# Patient Record
Sex: Female | Born: 1980 | Race: White | Hispanic: No | Smoking: Never smoker
Health system: Southern US, Community
[De-identification: ages and names within clinical notes are randomized; demographics above are authoritative.]

## PROBLEM LIST (undated history)

## (undated) DIAGNOSIS — K219 Gastro-esophageal reflux disease without esophagitis: Secondary | ICD-10-CM

## (undated) DIAGNOSIS — R197 Diarrhea, unspecified: Secondary | ICD-10-CM

## (undated) DIAGNOSIS — R110 Nausea: Secondary | ICD-10-CM

## (undated) HISTORY — DX: Gastro-esophageal reflux disease without esophagitis: K21.9

## (undated) HISTORY — DX: Diarrhea, unspecified: R19.7

## (undated) HISTORY — DX: Nausea: R11.0

## (undated) HISTORY — PX: TUBAL LIGATION: SHX77

---

## 2014-08-29 ENCOUNTER — Other Ambulatory Visit (HOSPITAL_COMMUNITY): Payer: Self-pay | Admitting: Specialist

## 2014-08-29 DIAGNOSIS — Z87798 Personal history of other (corrected) congenital malformations: Secondary | ICD-10-CM

## 2014-09-12 ENCOUNTER — Ambulatory Visit (HOSPITAL_COMMUNITY): Payer: Self-pay

## 2014-09-14 ENCOUNTER — Ambulatory Visit (HOSPITAL_COMMUNITY)
Admission: RE | Admit: 2014-09-14 | Discharge: 2014-09-14 | Disposition: A | Discharge status: Home / Self Care | Payer: PRIVATE HEALTH INSURANCE | Source: Ambulatory Visit | Attending: Specialist | Admitting: Specialist

## 2014-09-14 ENCOUNTER — Encounter (HOSPITAL_COMMUNITY): Payer: Self-pay

## 2014-09-14 ENCOUNTER — Other Ambulatory Visit (HOSPITAL_COMMUNITY): Payer: Self-pay | Admitting: Specialist

## 2014-09-14 DIAGNOSIS — Z8679 Personal history of other diseases of the circulatory system: Secondary | ICD-10-CM | POA: Diagnosis present

## 2014-09-14 DIAGNOSIS — Z3A20 20 weeks gestation of pregnancy: Secondary | ICD-10-CM | POA: Insufficient documentation

## 2014-09-14 DIAGNOSIS — Q249 Congenital malformation of heart, unspecified: Principal | ICD-10-CM

## 2014-09-14 DIAGNOSIS — Z87798 Personal history of other (corrected) congenital malformations: Secondary | ICD-10-CM

## 2014-09-14 DIAGNOSIS — O9989 Other specified diseases and conditions complicating pregnancy, childbirth and the puerperium: Secondary | ICD-10-CM | POA: Insufficient documentation

## 2014-09-14 DIAGNOSIS — Z3A21 21 weeks gestation of pregnancy: Secondary | ICD-10-CM | POA: Diagnosis not present

## 2014-09-14 DIAGNOSIS — O99412 Diseases of the circulatory system complicating pregnancy, second trimester: Secondary | ICD-10-CM | POA: Insufficient documentation

## 2014-09-14 NOTE — Consult Note (Signed)
Maternal Fetal Medicine Consultation  Requesting Provider(s): Jeannett SeniorAlice Newell, MD  Reason for consultation: History of VSD  HPI: Nicole Floyd is a 34 yo G1P0 currently at 21w 5d who was seen for consultation due to a personal history of VSD.  The patient did not require surgical repair and the VSD closed spontaneously.  The patient underwent a cardiology evaluation in August 2015 due to complaints of palpitations.  The patient reports that the echocardiogram was within normal limits and no further evaluation or treatment was recommended by cardiology.  The patient reports that her mother was diagnosed with peripartum cardiomyopathy - which has since improved over time.  Her prenatal course has otherwise been uncomplicated.  She is without complaints today.  OB History: OB History    Gravida Para Term Preterm AB TAB SAB Ectopic Multiple Living   1 0 0 0 0 0 0 0 0 0       PMH: as per HPI  PSH: Fracture of the right forearm  Meds: Prenatal vitamins  Allergies: No Known Allergies   FH: denies family history of birth defects or hereditary disorders  Soc:  History   Social History  . Marital Status: Unknown    Spouse Name: N/A    Number of Children: N/A  . Years of Education: N/A   Occupational History  . Not on file.   Social History Main Topics  . Smoking status: Not on file  . Smokeless tobacco: Not on file  . Alcohol Use: Not on file  . Drug Use: Not on file  . Sexual Activity: Not on file   Other Topics Concern  . Not on file   Social History Narrative    Review of Systems: no vaginal bleeding or cramping/contractions, no LOF, no nausea/vomiting. All other systems reviewed and are negative   PE:  239.5 #, 133/50, 91  GEN: well-appearing female ABD: gravid, NT  Ultrasound: Single IUP at 21w 5d Personal history of VSD - did not require surgical repair Mild, bilateral urinary tract dilation without calyceal dilation is noted (UTD A1-2) The remainder of the  fetal anatomy appears normal The femur length measures at the 3rd %tile. All other long bones are appropriate. (likely of no clinical significance) Posterior placenta without previa Normal amniotic fluid volume  A/P: 1) Single IUP at 21w 5d         2) Personal history of VSD - resolved spontaneously.  Do not anticipate any issues with the pregnancy.  Maternal congenital heart defects are associated with a higher risk of CHD in offspring - approx 2% but risk varies based on the specific lesion. See separate note from [employer] [facility] Counselor. The screening heart exam today appears normal, but a small VSD can be easily missed.  Will schedule a formal fetal Echo with Peds cardiology.         3) Mild urinary tract dilation (pyelectasis) noted on exam today - will reevaluate in the 3rd trimester.     Thank you for the opportunity to be a part of the care of Nicole Floyd. Please contact our office if we can be of further assistance.   I spent approximately 30 minutes with this patient with over 50% of time spent in face-to-face counseling.  Alpha GulaPaul Kenzleigh Sedam, MD Maternal Fetal Medicine

## 2014-09-14 NOTE — Progress Notes (Signed)
Genetic Counseling  High-Risk Gestation Note  Appointment Date:  09/14/2014 Referred By: Jamey Reas, MD Date of Birth:  1981/07/29   Pregnancy History: G1P0000 Estimated Date of Delivery: 01/26/15 Estimated Gestational Age: 68w6dAttending: PBenjaman Lobe MD   I met with Mrs. Nicole Vassellfor genetic counseling because of a personal history of congenital heart disease.  We began by reviewing the family history in detail. Nicole Floyd reported that she was born with a heart murmur which closed on its own. She did not require surgical or medical treatment. The patient's records indicate that this was a ventricular septal defect. Nicole Floyd stated that she did not recall the specific type of congenital heart disease. She is otherwise reportedly healthy. She reported no known exposures or complications during her mother's pregnancy with her. No additional relatives were reported with congenital heart disease. Congenital heart defects occur in approximately 1% of pregnancies.  Congenital heart defects may occur due to multifactorial influences, chromosomal abnormalities, genetic syndromes or environmental exposures.  Isolated heart defects are generally multifactorial.  Given the reported family history and assuming multifactorial inheritance, we discussed that recurrence risk for an isolated congenital heart defect for offspring of a mother with congenital heart disease is approximately 6%. We discussed the screening options of targeted ultrasound, which was performed today, and fetal echocardiogram. The patient elected to schedule a fetal echocardiogram but stated that would further consider this screening option and is not sure that she is interested in this exam during the pregnancy.   The family histories were otherwise found to be noncontributory for birth defects, mental retardation, and known genetic conditions. Without further information regarding the provided family history, an accurate  genetic risk cannot be calculated. Further genetic counseling is warranted if more information is obtained.  Detailed ultrasound was performed at the time of today's visit. Complete ultrasound results reported separately. Mild, bilateral urinary tract dilation (pyelectasis) was visualized at this time (451mbilaterally). We discussed that fetal urinary tract dilation/pyelectasis is defined as the dilatation of the fetal renal pelvis/pelvises due to excess urine. This finding is estimated to occur in 2-3% of fetuses.  The female to female ratio is 2:1.  Typically, babies with mild pyelectasis are born normal and healthy and we are usually unable to determine why this extra fluid is present.  This urine accumulation may regress, stay the same or continue to accumulate.  The more fluid that accumulates, the more likely this fluid could be the result of a compromise in kidney function, an obstruction, or narrowing of the ureters which transport urine out of the body, thus causing backflow of fluid into the kidneys.  Therefore, it is important to follow pyelectasis to make sure it does not become more concerning.  Also, in some cases postnatal evaluation of baby's kidneys may be warranted.  We discussed that the finding of pyelectasis is associated with an increased risk for fetal aneuploidy.  This risk is highest when other anomalies or fetal differences are visualized.   We briefly discussed available screening and testing options for fetal aneuploidy, specifically Down syndrome. Given the patient's age alone, the pregnancy is not considered to be at increased risk for fetal aneuploidy. Nicole Floyd declined detailed discussion of screening and testing options for Down syndrome. She stated that she previously declined these optional screens and is not interested in screening/testing for Down syndrome in the pregnancy. Follow-up ultrasound was recommended in the third trimester to reassess fetal kidneys and was  scheduled for 11/16/14. The patient  planned to further discuss this option with her OB. She stated that she may not be interested in this follow-up exam in our office given the travel distance for her. She understands that ultrasound cannot rule out all birth defects or genetic syndromes.  Nicole Floyd was provided with written information regarding cystic fibrosis (CF) including the carrier frequency and incidence in the Caucasian and Hispanic populations, the availability of carrier testing and prenatal diagnosis if indicated.  In addition, we discussed that CF is routinely screened for as part of the Protection newborn screening panel.  She declined CF carrier testing today.   Nicole Floyd denied exposure to environmental toxins or chemical agents. She denied the use of alcohol, tobacco or street drugs. She denied significant viral illnesses during the course of her pregnancy. Her medical and surgical histories were contributory for congenital heart disease as previously discussed. Please see separate MFM consult note for additional discussion regarding maternal medical history.    I counseled Nicole Floyd regarding the above risks and available options.  The approximate face-to-face time with the genetic counselor was 30 minutes.  Chipper Oman, MS Certified Genetic Counselor 09/14/2014

## 2014-09-17 ENCOUNTER — Other Ambulatory Visit (HOSPITAL_COMMUNITY): Payer: Self-pay

## 2014-11-16 ENCOUNTER — Ambulatory Visit (HOSPITAL_COMMUNITY): Payer: PRIVATE HEALTH INSURANCE

## 2015-03-16 IMAGING — US US OB DETAIL+14 WK
1 series · 12 of 28 positions shown · non-contrast
Comparison: none

[Series 1: maternal vsd · 0.23mm/px · 12 of 113 slices shown]
[im 5/113]
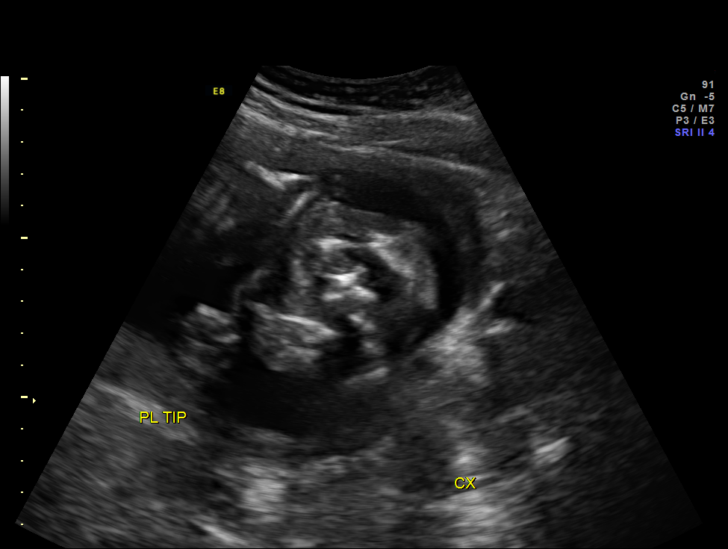
[im 13/113]
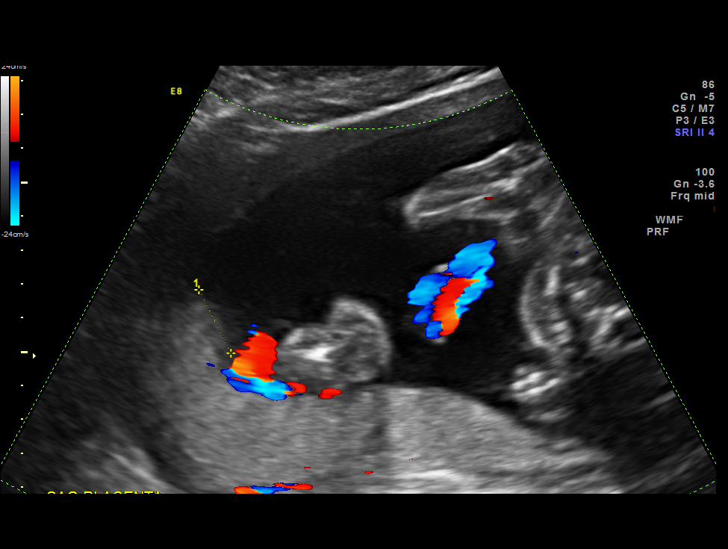
[im 21/113]
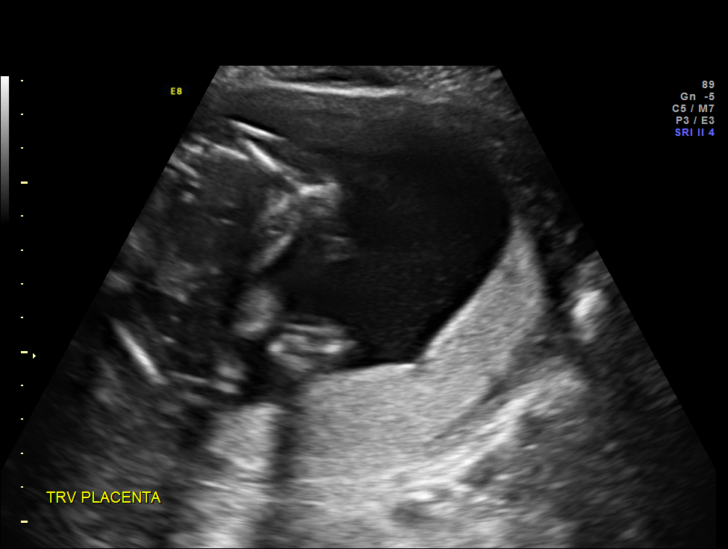
[im 34/113]
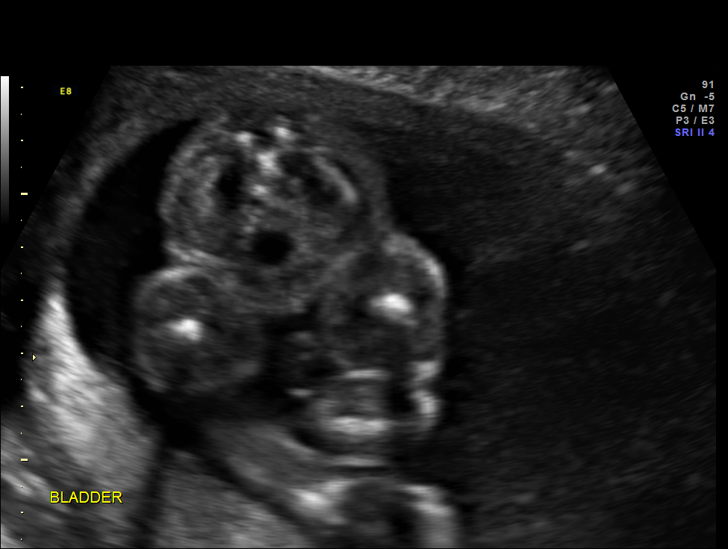
[im 42/113]
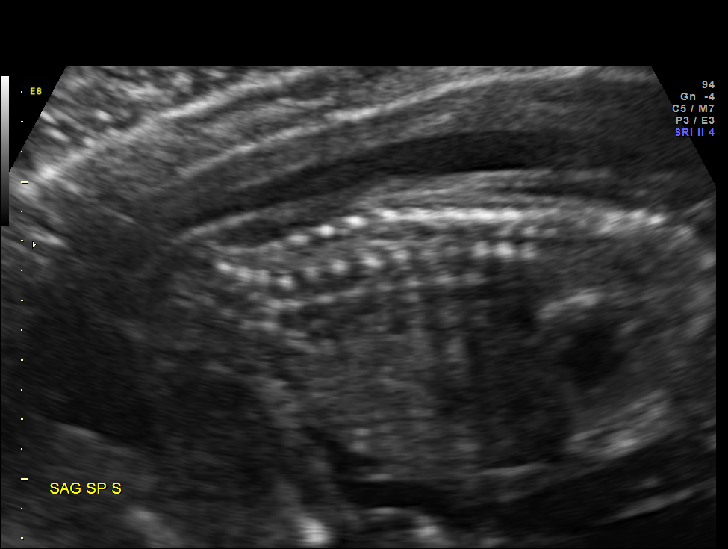
[im 50/113]
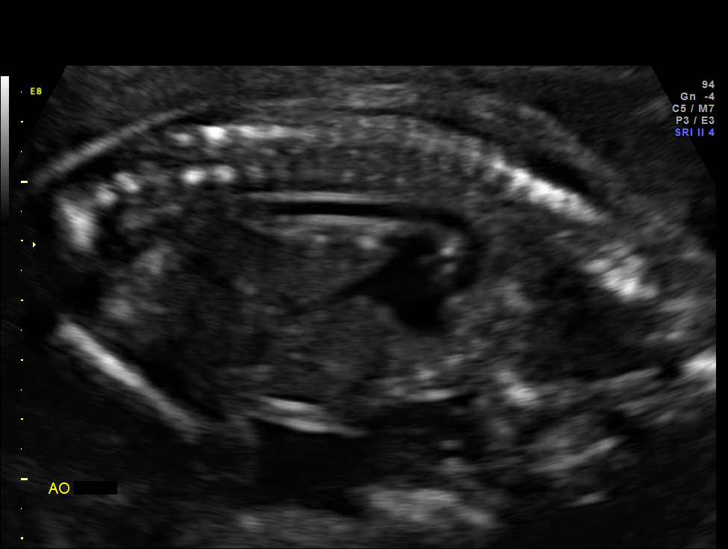
[im 63/113]
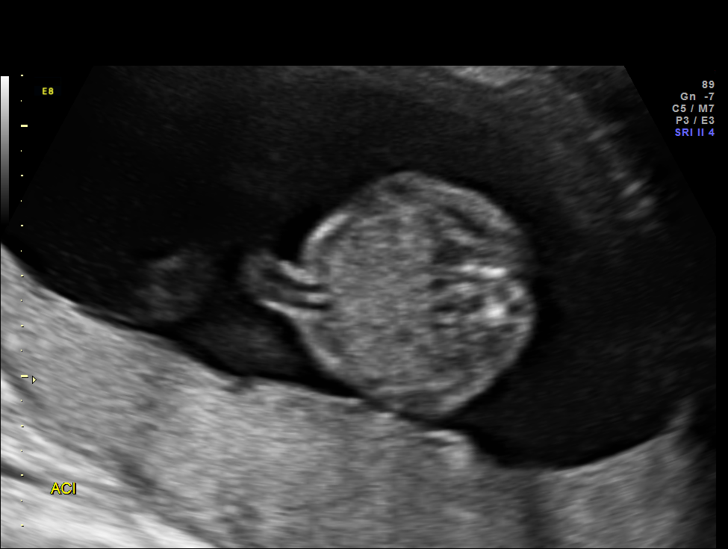
[im 71/113]
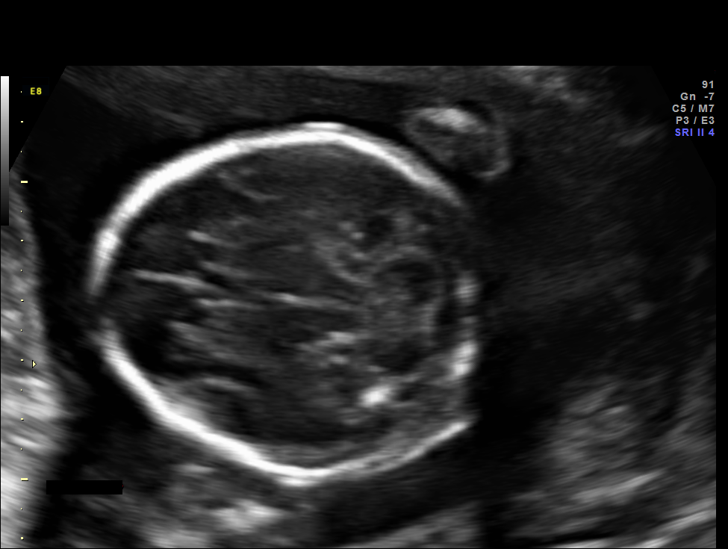
[im 79/113]
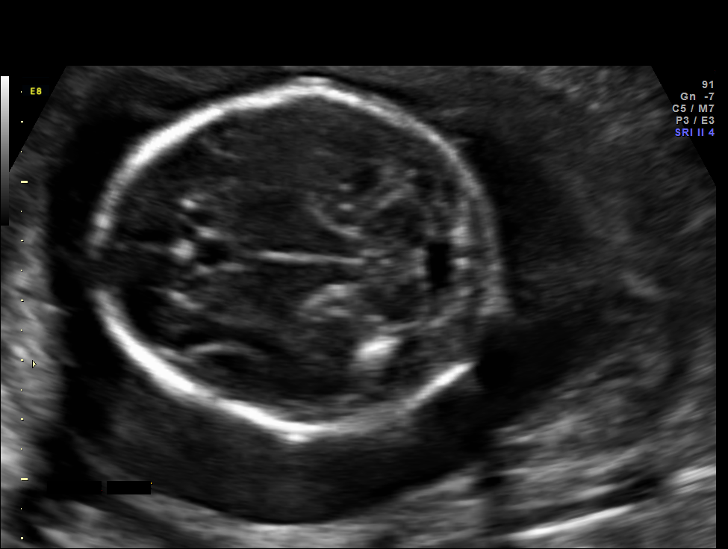
[im 92/113]
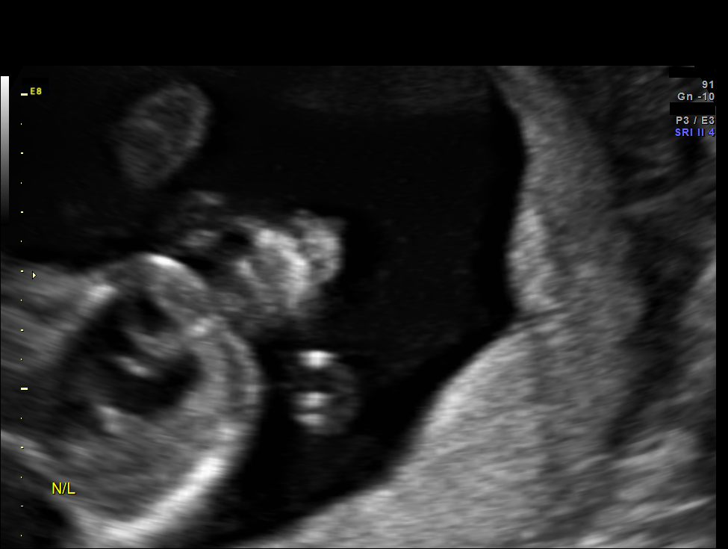
[im 100/113]
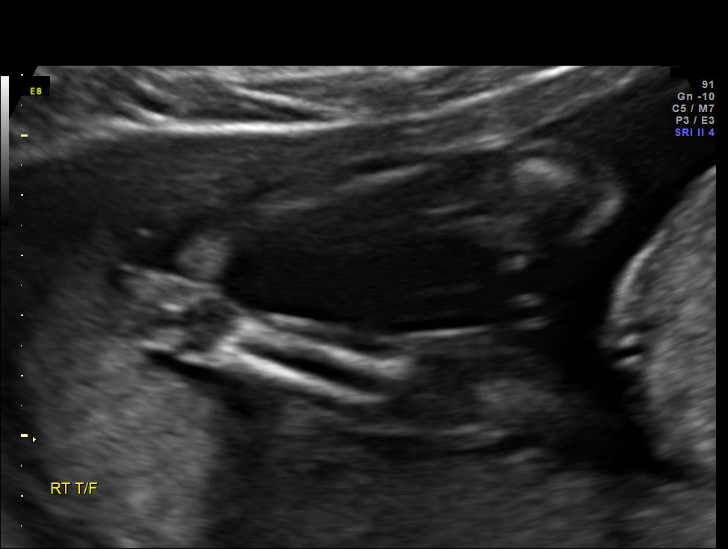
[im 108/113]
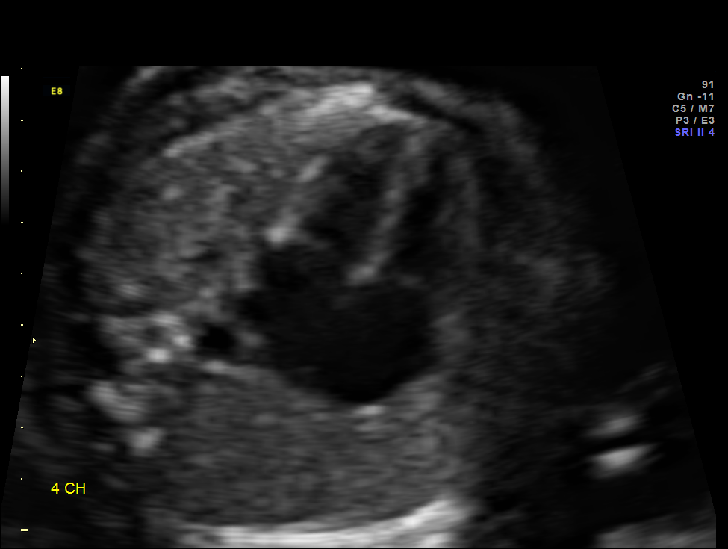

[12 of 28 positions shown; findings below may reference images not displayed]

OBSTETRICS REPORT
                      (Signed Final 09/14/2014 [DATE])

Service(s) Provided

 US OB DETAIL + 14 WK                                  76811.0
Indications

 21 weeks gestation of pregnancy
 History of genetic / anatomic abnormality - patient
 with VSD
 Detailed fetal anatomic survey                        Z36
Fetal Evaluation

 Num Of Fetuses:    1
 Fetal Heart Rate:  142                          bpm
 Cardiac Activity:  Observed
 Presentation:      Cephalic
 Placenta:          Posterior, above cervical
                    os
 P. Cord            Visualized, central
 Insertion:

 Amniotic Fluid
 AFI FV:      Subjectively within normal limits
                                             Larg Pckt:    6.12  cm
Biometry

 BPD:     56.3  mm     G. Age:  23w 1d                CI:         79.4   70 - 86
 OFD:     70.9  mm                                    FL/HC:      15.8   18.4 -

 HC:     202.7  mm     G. Age:  22w 3d       68  %    HC/AC:      1.16   1.06 -

 AC:     174.5  mm     G. Age:  22w 2d       64  %    FL/BPD:     56.8   71 - 87
 FL:        32  mm     G. Age:  20w 0d        3  %    FL/AC:      18.3   20 - 24
 HUM:     32.4  mm     G. Age:  20w 6d       27  %
 HUM:     34.1  mm     G. Age:  21w 4d       46  %
 FL:        32  mm     G. Age:  20w 0d        3  %
 ULN:     30.6  mm     G. Age:  21w 3d       31  %
 TIB:     28.6  mm     G. Age:  20w 3d       18  %
 RAD:     26.7  mm     G. Age:  19w 6d       23  %
 FIB:       28  mm     G. Age:  20w 0d       37  %

 Est. FW:     427  gm    0 lb 15 oz      39  %
Gestational Age
 LMP:           20w 6d        Date:  04/21/14                 EDD:   01/26/15
 U/S Today:     22w 0d                                        EDD:   01/18/15
 Best:          21w 5d     Det. By:  Early Ultrasound         EDD:   01/20/15
                                     (07/10/14)
Anatomy

 Cranium:          Appears normal         Aortic Arch:      Appears normal
 Fetal Cavum:      Appears normal         Ductal Arch:      Appears normal
 Ventricles:       Appears normal         Diaphragm:        Appears normal
 Choroid Plexus:   Appears normal         Stomach:          Appears normal, left
                                                            sided
 Cerebellum:       Appears normal         Abdomen:          Appears normal
 Posterior Fossa:  Appears normal         Abdominal Wall:   Appears nml (cord
                                                            insert, abd wall)
 Nuchal Fold:      Appears normal         Cord Vessels:     Appears normal (3
                                                            vessel cord)
 Face:             Profile appears        Kidneys:          Bilat pyelectasis,
                   normal
                                                            Rt 4mm, Lt 4mm
 Lips:             Appears normal         Bladder:          Appears normal
 Heart:            Appears normal         Spine:            Appears normal
                   (4CH, axis, and
                   situs)
 RVOT:             Appears normal         Lower             Appears normal
                                          Extremities:
 LVOT:             Appears normal         Upper             Appears normal
                                          Extremities:

 Other:  Fetus appears to be a male.
Cervix Uterus Adnexa

 Cervical Length:    4.26     cm

 Cervix:       Normal appearance by transabdominal scan.
 Uterus:       No abnormality visualized.
 Cul De Sac:   No free fluid seen.
 Left Ovary:    Not visualized.
 Right Ovary:   Not visualized.

 Adnexa:     No abnormality visualized.
Impression

 Single IUP at 21w 5d
 Personal history of VSD - did not require surgical repair
 Mild, bilateral urinary tract dilation without calyceal dilation is
 noted (UTD A1-2)
 The remainder of the fetal anatomy appears normal
 The femur length measures at the 3rd %tile. All other long
 bones are appropriate. (likely of no clinical significance)
 Posterior placenta without previa
 Normal amniotic fluid volume
Recommendations

 Fetal echo scheduled
 Recommend follow up in the 3rd trimester to reevaluate the
 fetal kidneys.
 questions or concerns.

## 2015-07-20 ENCOUNTER — Encounter (HOSPITAL_COMMUNITY): Payer: Self-pay | Admitting: *Deleted

## 2016-03-24 ENCOUNTER — Ambulatory Visit: Payer: Self-pay | Admitting: "Endocrinology

## 2016-05-27 ENCOUNTER — Encounter: Payer: Self-pay | Admitting: "Endocrinology

## 2016-06-19 ENCOUNTER — Ambulatory Visit: Payer: Self-pay | Admitting: "Endocrinology

## 2024-05-10 ENCOUNTER — Encounter: Payer: Self-pay | Admitting: Gastroenterology

## 2024-06-29 ENCOUNTER — Encounter: Payer: Self-pay | Admitting: Gastroenterology

## 2024-06-29 ENCOUNTER — Ambulatory Visit: Payer: PRIVATE HEALTH INSURANCE | Admitting: Gastroenterology

## 2024-06-29 ENCOUNTER — Other Ambulatory Visit (INDEPENDENT_AMBULATORY_CARE_PROVIDER_SITE_OTHER): Payer: PRIVATE HEALTH INSURANCE

## 2024-06-29 ENCOUNTER — Other Ambulatory Visit: Payer: Self-pay | Admitting: Gastroenterology

## 2024-06-29 VITALS — BP 106/68 | HR 66 | Ht 68.0 in | Wt 259.0 lb

## 2024-06-29 DIAGNOSIS — R197 Diarrhea, unspecified: Secondary | ICD-10-CM

## 2024-06-29 DIAGNOSIS — K6289 Other specified diseases of anus and rectum: Secondary | ICD-10-CM

## 2024-06-29 DIAGNOSIS — R110 Nausea: Secondary | ICD-10-CM | POA: Diagnosis not present

## 2024-06-29 DIAGNOSIS — R194 Change in bowel habit: Secondary | ICD-10-CM

## 2024-06-29 DIAGNOSIS — K219 Gastro-esophageal reflux disease without esophagitis: Secondary | ICD-10-CM

## 2024-06-29 DIAGNOSIS — R1084 Generalized abdominal pain: Secondary | ICD-10-CM

## 2024-06-29 LAB — CBC WITH DIFFERENTIAL/PLATELET
Basophils Absolute: 0 K/uL (ref 0.0–0.1)
Basophils Relative: 0.8 % (ref 0.0–3.0)
Eosinophils Absolute: 0.1 K/uL (ref 0.0–0.7)
Eosinophils Relative: 1.3 % (ref 0.0–5.0)
HCT: 39.4 % (ref 36.0–46.0)
Hemoglobin: 13.2 g/dL (ref 12.0–15.0)
Lymphocytes Relative: 33 % (ref 12.0–46.0)
Lymphs Abs: 2.1 K/uL (ref 0.7–4.0)
MCHC: 33.5 g/dL (ref 30.0–36.0)
MCV: 92.8 fl (ref 78.0–100.0)
Monocytes Absolute: 0.6 K/uL (ref 0.1–1.0)
Monocytes Relative: 8.8 % (ref 3.0–12.0)
Neutro Abs: 3.7 K/uL (ref 1.4–7.7)
Neutrophils Relative %: 56.1 % (ref 43.0–77.0)
Platelets: 292 K/uL (ref 150.0–400.0)
RBC: 4.24 Mil/uL (ref 3.87–5.11)
RDW: 12.6 % (ref 11.5–15.5)
WBC: 6.5 K/uL (ref 4.0–10.5)

## 2024-06-29 LAB — TSH: TSH: 2.68 u[IU]/mL (ref 0.35–5.50)

## 2024-06-29 LAB — COMPREHENSIVE METABOLIC PANEL WITH GFR
ALT: 13 U/L (ref 0–35)
AST: 16 U/L (ref 0–37)
Albumin: 4.5 g/dL (ref 3.5–5.2)
Alkaline Phosphatase: 75 U/L (ref 39–117)
BUN: 11 mg/dL (ref 6–23)
CO2: 29 meq/L (ref 19–32)
Calcium: 9.3 mg/dL (ref 8.4–10.5)
Chloride: 100 meq/L (ref 96–112)
Creatinine, Ser: 0.87 mg/dL (ref 0.40–1.20)
GFR: 81.34 mL/min (ref >60.00)
Glucose, Bld: 119 mg/dL — ABNORMAL HIGH (ref 70–99)
Potassium: 3.7 meq/L (ref 3.5–5.1)
Sodium: 138 meq/L (ref 135–145)
Total Bilirubin: 0.4 mg/dL (ref 0.2–1.2)
Total Protein: 7.6 g/dL (ref 6.0–8.3)

## 2024-06-29 LAB — LIPASE: Lipase: 48 U/L (ref 11.0–59.0)

## 2024-06-29 MED ORDER — PANTOPRAZOLE SODIUM 40 MG PO TBEC: 40.0000 mg | DELAYED_RELEASE_TABLET | Freq: Every day | ORAL | 3 refills | Status: DC

## 2024-06-29 MED ORDER — NA SULFATE-K SULFATE-MG SULF 17.5-3.13-1.6 GM/177ML PO SOLN: 1.0000 | Freq: Once | ORAL | 0 refills | Status: AC

## 2024-06-29 NOTE — Patient Instructions (Addendum)
 You have been scheduled for an abdominal ultrasound at [facility] on 07/07/24 at 9:30 am. Please arrive 30 minutes prior to your appointment for registration. Make certain not to have anything to eat or drink after midnight prior to your appointment. Should you need to reschedule your appointment, please contact radiology at [phone]. This test typically takes about 30 minutes to perform.  You have been scheduled for an endoscopy and colonoscopy. Please follow the written instructions given to you at your visit today.  If you use inhalers (even only as needed), please bring them with you on the day of your procedure.  DO NOT TAKE 7 DAYS PRIOR TO TEST- Trulicity (dulaglutide) Ozempic, Wegovy (semaglutide) Mounjaro (tirzepatide) Bydureon Bcise (exanatide extended release)  DO NOT TAKE 1 DAY PRIOR TO YOUR TEST Rybelsus (semaglutide) Adlyxin (lixisenatide) Victoza (liraglutide) Byetta (exanatide) ___________________________________________________________________________  Your provider has requested that you go to the basement level for lab work before leaving today. Press B on the elevator. The lab is located at the first door on the left as you exit the elevator.   Stop Omeprazole.  Start fiber supplement, Benefiber 1 tbsp daily.  We have sent the following medications to your pharmacy for you to pick up at your convenience: Pantoprazole 40 mg daily

## 2024-06-29 NOTE — Progress Notes (Signed)
 "  Nicole Floyd [id number] [birth year]   Chief Complaint: GERD, diarrhea  Referring Provider: Starla Stapler, FNP Primary GI MD: Sampson  HPI: Nicole Floyd is a 43 y.o. female with past medical history of hypothyroidism, vitamin D deficiency, obesity, GERD who presents today for a complaint of GERD.    Referred by PCP for intermittent diarrhea.  Per referral note, having heartburn issues and frequent diarrhea.  Omeprazole helping some with reflux but bowel habit changes not improved.  Requested GI referral.  Stool studies 07/2023: Negative C. difficile and stool culture  Labs 10/14/2023: Normal lipase, CMP, CBC without anemia, negative alpha gal  Labs 03/09/2024: Normal TSH, hemoglobin A1c 5.4  No apparent testing for H. Pylori.   Discussed the use of AI scribe software for clinical note transcription with the patient, who gave verbal consent to proceed.  History of Present Illness Nicole Floyd is a 42 year old female who presents with gastrointestinal symptoms including diarrhea and reflux.  She has been experiencing gastrointestinal symptoms for the past year, including diarrhea and reflux. Omeprazole alleviates the reflux but causes constipation. The reflux presents as a burning sensation in the abdomen and is sometimes associated with palpitations. She has a history of thyroid issues managed with medication adjustments, which she believes may have contributed to previous palpitations.  Reports having a normal workup with cardiology.  She experiences frequent bowel movements, up to five times a day, with more frequency at home. Bowel movements often start with a normal stool followed by several trips to the bathroom with loose or diarrhea-type stools. Abdominal discomfort occurs after eating, described as 'acid all throughout' her abdomen, and she sometimes experiences nausea without vomiting. Occasional black stools have been noted, but no regular blood in the stool. Anal  irritation and itching occur due to frequent bowel movements, managed with Vaseline lotion. No blood on toilet paper or lumps suggesting hemorrhoids.  Has not had recent imaging studies like an ultrasound or CT scan. She has not undergone an upper endoscopy or colonoscopy before.  There is a family history of celiac disease in a cousin, but no immediate family history of esophageal, stomach, or colon cancer, nor inflammatory bowel disease.  She has had recent episodes of lower abdominal cramping, which led her to visit urgent care, suspecting a UTI, but tests were negative. She also experienced a false positive strep test. She has had a tubal ligation but no other abdominal surgeries. No unintentional weight loss or significant changes in appetite.  Previous GI Procedures/Imaging      Past Medical History:  Diagnosis Date   Diarrhea    GERD (gastroesophageal reflux disease)    Nausea     Past Surgical History:  Procedure Laterality Date   TUBAL LIGATION      Current Outpatient Medications  Medication Sig Dispense Refill   Cholecalciferol (VITAMIN D-3) 25 MCG (1000 UT) CAPS Take by mouth.     ipratropium (ATROVENT) 0.06 % nasal spray Place 2 sprays into both nostrils 4 (four) times daily.     levocetirizine (XYZAL) 2.5 MG/5ML solution Take 2.5 mg by mouth every evening.     levothyroxine (SYNTHROID) 125 MCG tablet Take 125 mcg by mouth every morning.     Oxymetazoline HCl (NASAL SPRAY NA) Place into the nose. (Patient not taking: Reported on 06/29/2024)     Prenatal Vit-Fe Fumarate-FA (PRENATAL MULTIVITAMIN) TABS tablet Take 1 tablet by mouth daily at 12 noon. (Patient not taking: Reported on 06/29/2024)     No current  facility-administered medications for this visit.    Allergies as of 06/29/2024   (No Known Allergies)    Family History  Problem Relation Age of Onset   Diabetes Father    Hypertension Father     Social History   Tobacco Use   Smoking status: Never    Smokeless tobacco: Never  Vaping Use   Vaping status: Never Used  Substance Use Topics   Alcohol use: Never     Review of Systems:    Constitutional: No weight loss, fever, chills Cardiovascular: No chest pain Respiratory: No SOB  Gastrointestinal: See HPI and otherwise negative   Physical Exam:  Vital signs: BP 106/68   Pulse 66   Ht 5' 8 (1.727 m)   Wt 259 lb (117.5 kg)   BMI 39.38 kg/m   Constitutional: Pleasant, obese female in NAD, alert and cooperative Head:  Normocephalic and atraumatic.  Eyes: No scleral icterus.  Respiratory: Respirations even and unlabored. Lungs clear to auscultation bilaterally.  No wheezes, crackles, or rhonchi.  Cardiovascular:  Regular rate and rhythm. No murmurs. No peripheral edema. Gastrointestinal:  Soft, nondistended, generalized discomfort/fullness to palpation but no pain. No rebound or guarding. Normal bowel sounds. No appreciable masses or hepatomegaly. Rectal:  Not performed.  Neurologic:  Alert and oriented x4;  grossly normal neurologically.  Skin:   Dry and intact without significant lesions or rashes. Psychiatric: Oriented to person, place and time. Demonstrates good judgement and reason without abnormal affect or behaviors.   Assessment/Plan:   GERD Nausea Diarrhea Generalized abdominal pain Change in bowel habits Anal irritation Patient seen today for multiple GI complaints, with symptoms ongoing for about a year now.  Has frequent acid reflux and abdominal burning sensation which can be diffuse.  States that omeprazole did help with the reflux but she felt that it caused constipation. States that she often has frequent bowel movements, up to 5 times a day, which is worse when she is at home.  Has fewer bowel movements when she is out and busy.  Often can have a normal stool, followed by several loose stools.  Reports some irritation after bowel movements in her perianal area which is relieved with Vaseline lotion.  Denies  any rectal bleeding, but has seen black stools occasionally.  No prior EGD or colonoscopy.  Denies any recent abdominal imaging studies. Tends to have discomfort after eating and sometimes has nausea and vomiting. History of thyroid dysfunction.  - Schedule EGD/colonoscopy. I thoroughly discussed the procedure with the patient to include nature of the procedure, alternatives, benefits, and risks (including but not limited to bleeding, infection, perforation, anesthesia/cardiac/pulmonary complications). Patient verbalized understanding and gave verbal consent to proceed with procedure.  - Order RUQ US  - Labs today: CBC, CMP, TSH, TTG, IgA, lipase - Stop omeprazole - Start Protonix  40 mg daily - Start Benefiber 1 tablespoon daily   Camie Furbish, PA-C [facility] 06/29/2024, 2:38 PM  Patient Care Team: Starla Stapler, FNP as PCP - General (Family Medicine)   "

## 2024-06-30 ENCOUNTER — Encounter: Payer: Self-pay | Admitting: Gastroenterology

## 2024-06-30 LAB — TISSUE TRANSGLUTAMINASE, IGA: (tTG) Ab, IgA: <1 U/mL

## 2024-06-30 LAB — IGA: Immunoglobulin A: 295 mg/dL (ref 47–310)

## 2024-07-03 ENCOUNTER — Ambulatory Visit: Payer: Self-pay | Admitting: Gastroenterology

## 2024-07-07 ENCOUNTER — Ambulatory Visit (HOSPITAL_COMMUNITY)
Admission: RE | Admit: 2024-07-07 | Discharge: 2024-07-07 | Disposition: A | Discharge status: Home / Self Care | Payer: PRIVATE HEALTH INSURANCE | Source: Ambulatory Visit | Attending: Gastroenterology | Admitting: Gastroenterology

## 2024-07-07 DIAGNOSIS — R1084 Generalized abdominal pain: Secondary | ICD-10-CM | POA: Diagnosis present

## 2024-07-07 DIAGNOSIS — R110 Nausea: Secondary | ICD-10-CM | POA: Insufficient documentation

## 2024-07-07 NOTE — Progress Notes (Signed)
 ____________________________________________________________  Attending physician addendum:  Thank you for sending this case to me. I have reviewed the entire note and agree with the plan.   Victory Brand, MD  ____________________________________________________________

## 2024-07-28 ENCOUNTER — Encounter: Payer: Self-pay | Admitting: Gastroenterology

## 2024-08-02 ENCOUNTER — Ambulatory Visit: Payer: PRIVATE HEALTH INSURANCE | Admitting: Gastroenterology

## 2024-08-02 ENCOUNTER — Encounter: Payer: Self-pay | Admitting: Gastroenterology

## 2024-08-02 VITALS — BP 117/76 | HR 70 | Temp 98.7°F | Resp 17 | Ht 68.0 in | Wt 259.0 lb

## 2024-08-02 DIAGNOSIS — K3189 Other diseases of stomach and duodenum: Secondary | ICD-10-CM

## 2024-08-02 DIAGNOSIS — R110 Nausea: Secondary | ICD-10-CM

## 2024-08-02 DIAGNOSIS — K2950 Unspecified chronic gastritis without bleeding: Secondary | ICD-10-CM

## 2024-08-02 DIAGNOSIS — K529 Noninfective gastroenteritis and colitis, unspecified: Secondary | ICD-10-CM

## 2024-08-02 DIAGNOSIS — R12 Heartburn: Secondary | ICD-10-CM

## 2024-08-02 DIAGNOSIS — R1030 Lower abdominal pain, unspecified: Secondary | ICD-10-CM

## 2024-08-02 MED ORDER — SODIUM CHLORIDE 0.9 % IV SOLN: 500.0000 mL | Freq: Once | INTRAVENOUS | Status: DC

## 2024-08-02 NOTE — Patient Instructions (Signed)
 YOU HAD AN ENDOSCOPIC PROCEDURE TODAY AT THE [facility]:   Refer to the procedure report that was given to you for any specific questions about what was found during the examination.  If the procedure report does not answer your questions, please call your gastroenterologist to clarify.  If you requested that your care partner not be given the details of your procedure findings, then the procedure report has been included in a sealed envelope for you to review at your convenience later.  YOU SHOULD EXPECT: Some feelings of bloating in the abdomen. Passage of more gas than usual.  Walking can help get rid of the air that was put into your GI tract during the procedure and reduce the bloating. If you had a lower endoscopy (such as a colonoscopy or flexible sigmoidoscopy) you may notice spotting of blood in your stool or on the toilet paper. If you underwent a bowel prep for your procedure, you may not have a normal bowel movement for a few days.  Please Note:  You might notice some irritation and congestion in your nose or some drainage.  This is from the oxygen used during your procedure.  There is no need for concern and it should clear up in a day or so.  SYMPTOMS TO REPORT IMMEDIATELY:  Following lower endoscopy (colonoscopy or flexible sigmoidoscopy):  Excessive amounts of blood in the stool  Significant tenderness or worsening of abdominal pains  Swelling of the abdomen that is new, acute  Fever of 100F or higher  Following upper endoscopy (EGD)  Vomiting of blood or coffee ground material  New chest pain or pain under the shoulder blades  Painful or persistently difficult swallowing  New shortness of breath  Fever of 100F or higher  Black, tarry-looking stools  Resume previous diet Continue present medications Await pathology results. If all normal, trial of anti spasmodic medicine Repeat colonoscopy in 10 years for screening purposes    For urgent or emergent  issues, a gastroenterologist can be reached at any hour by calling [phone]. Do not use MyChart messaging for urgent concerns.    DIET:  We do recommend a small meal at first, but then you may proceed to your regular diet.  Drink plenty of fluids but you should avoid alcoholic beverages for 24 hours.  ACTIVITY:  You should plan to take it easy for the rest of today and you should NOT DRIVE or use heavy machinery until tomorrow (because of the sedation medicines used during the test).    FOLLOW UP: Our staff will call the number listed on your records the next business day following your procedure.  We will call around 7:15- 8:00 am to check on you and address any questions or concerns that you may have regarding the information given to you following your procedure. If we do not reach you, we will leave a message.     If any biopsies were taken you will be contacted by phone or by letter within the next 1-3 weeks.  Please call us  at [phone] if you have not heard about the biopsies in 3 weeks.    SIGNATURES/CONFIDENTIALITY: You and/or your care partner have signed paperwork which will be entered into your electronic medical record.  These signatures attest to the fact that that the information above on your After Visit Summary has been reviewed and is understood.  Full responsibility of the confidentiality of this discharge information lies with you and/or your care-partner.

## 2024-08-02 NOTE — Op Note (Signed)
  [facility] Patient Name: Nicole Floyd Procedure Date: 08/02/2024 10:47 AM MRN: [id number] Endoscopist: Victory L. Legrand , MD, [id number] Age: 43 Referring MD:  Date of Birth: 1981/02/01 Gender: Female Account #: [id number] Procedure:                Colonoscopy Indications:              Lower abdominal pain, Chronic diarrhea Medicines:                Monitored Anesthesia Care Procedure:                Pre-Anesthesia Assessment:                           - Prior to the procedure, a History and Physical                            was performed, and patient medications and                            allergies were reviewed. The patient's tolerance of                            previous anesthesia was also reviewed. The risks                            and benefits of the procedure and the sedation                            options and risks were discussed with the patient.                            All questions were answered, and informed consent                            was obtained. Prior Anticoagulants: The patient has                            taken no anticoagulant or antiplatelet agents. ASA                            Grade Assessment: II - A patient with mild systemic                            disease. After reviewing the risks and benefits,                            the patient was deemed in satisfactory condition to                            undergo the procedure.                           After obtaining informed consent, the colonoscope  was passed under direct vision. Throughout the                            procedure, the patient's blood pressure, pulse, and                            oxygen saturations were monitored continuously. The                            Olympus Scope [id number] was introduced through the                            anus and advanced to the the terminal ileum, with                             identification of the appendiceal orifice and IC                            valve. The colonoscopy was performed without                            difficulty. The patient tolerated the procedure                            well. The quality of the bowel preparation was                            good. The terminal ileum, ileocecal valve,                            appendiceal orifice, and rectum were photographed. Scope In: 11:07:42 AM Scope Out: 11:21:54 AM Scope Withdrawal Time: 0 hours 10 minutes 2 seconds  Total Procedure Duration: 0 hours 14 minutes 12 seconds  Findings:                 The perianal and digital rectal examinations were                            normal.                           The terminal ileum appeared normal.                           Repeat examination of right colon under NBI                            performed.                           Normal mucosa was found in the entire colon.                            Biopsies for histology were taken with a cold  forceps from the right colon and left colon for                            evaluation of microscopic colitis.                           The exam was otherwise without abnormality on                            direct and retroflexion views. Complications:            No immediate complications. Estimated Blood Loss:     Estimated blood loss was minimal. Impression:               - The examined portion of the ileum was normal.                           - Normal mucosa in the entire examined colon.                            Biopsied.                           - The examination was otherwise normal on direct                            and retroflexion views. Recommendation:           - Patient has a contact number available for                            emergencies. The signs and symptoms of potential                            delayed complications were discussed with the                             patient. Return to normal activities tomorrow.                            Written discharge instructions were provided to the                            patient.                           - Resume previous diet.                           - Continue present medications.                           - Await pathology results. If all normal, trial of                            anti-spasmodic medicine.                           -  Diarrhea/gas/bloating diatery recommendations                            will be provided with today's discharge                            instructions.                           - Repeat colonoscopy in 10 years for screening                            purposes.                           - See the other procedure note for documentation of                            additional recommendations. Niani Mourer L. Legrand, MD 08/02/2024 11:27:55 AM This report has been signed electronically.

## 2024-08-02 NOTE — Progress Notes (Signed)
 History and Physical:  This patient presents for endoscopic testing for: Encounter Diagnoses  Name Primary?   Nausea without vomiting Yes   Chronic diarrhea    Heartburn     43 year old patient here today for EGD and colonoscopy to evaluate both upper and lower GI symptoms.  Intermittent nausea as well as frequent heartburn requiring acid suppression therapy.  Chronic diarrhea for at least a year with a negative infectious workup.  Clinical details are further outlined in our APP office note dated 06/22/24 with no significant changes since then.  Patient is otherwise without complaints or active issues today.   Past Medical History: Past Medical History:  Diagnosis Date   Diarrhea    GERD (gastroesophageal reflux disease)    Nausea      Past Surgical History: Past Surgical History:  Procedure Laterality Date   TUBAL LIGATION      Allergies: No Known Allergies  Outpatient Meds: Current Outpatient Medications  Medication Sig Dispense Refill   Cholecalciferol (VITAMIN D-3) 25 MCG (1000 UT) CAPS Take by mouth.     ipratropium (ATROVENT) 0.06 % nasal spray Place 2 sprays into both nostrils 4 (four) times daily.     levocetirizine (XYZAL) 2.5 MG/5ML solution Take 2.5 mg by mouth every evening.     levothyroxine (SYNTHROID) 125 MCG tablet Take 125 mcg by mouth every morning.     Oxymetazoline HCl (NASAL SPRAY NA) Place into the nose. (Patient not taking: Reported on 06/29/2024)     pantoprazole  (PROTONIX ) 40 MG tablet TAKE 1 TABLET BY MOUTH EVERY DAY 90 tablet 1   Prenatal Vit-Fe Fumarate-FA (PRENATAL MULTIVITAMIN) TABS tablet Take 1 tablet by mouth daily at 12 noon. (Patient not taking: Reported on 06/29/2024)     Current Facility-Administered Medications  Medication Dose Route Frequency Provider Last Rate Last Admin   0.9 %  sodium chloride  infusion  500 mL Intravenous Once Danis, Kree Armato L III, MD           ___________________________________________________________________ Objective   Exam:  BP 120/73   Pulse 82   Temp 98.7 F (37.1 C) (Oral)   Ht 5' 8 (1.727 m)   Wt 259 lb (117.5 kg)   SpO2 97%   BMI 39.38 kg/m   CV: regular , S1/S2 Resp: clear to auscultation bilaterally, normal RR and effort noted GI: soft, no tenderness, with active bowel sounds.   Assessment: Encounter Diagnoses  Name Primary?   Nausea without vomiting Yes   Chronic diarrhea    Heartburn      Plan: Colonoscopy EGD  The benefits and risks of the planned procedure(s) were described in detail with the patient or (when appropriate) their health care proxy.  Risks were outlined as including, but not limited to, bleeding, infection, perforation, adverse medication reaction leading to cardiac or pulmonary decompensation, pancreatitis (if ERCP).  The limitation of incomplete mucosal visualization was also discussed.  No guarantees or warranties were given.  The patient was provided an opportunity to ask questions and all were answered. The patient agreed with the plan.   The patient is appropriate for an endoscopic procedure in the ambulatory setting.   - Victory Brand, MD

## 2024-08-02 NOTE — Progress Notes (Signed)
 Vss nad trans to pacu

## 2024-08-02 NOTE — Progress Notes (Signed)
 Pt's states no medical or surgical changes since previsit or office visit.

## 2024-08-02 NOTE — Progress Notes (Signed)
 Called to room to assist during endoscopic procedure.  Patient ID and intended procedure confirmed with present staff. Received instructions for my participation in the procedure from the performing physician.

## 2024-08-02 NOTE — Op Note (Signed)
 [facility] Patient Name: Nicole Floyd Procedure Date: 08/02/2024 10:33 AM MRN: [id number] Endoscopist: Victory L. Legrand , MD, [id number] Age: 43 Referring MD:  Date of Birth: [birth year] Gender: Female Account #: [id number] Procedure:                Upper GI endoscopy Indications:              Epigastric abdominal pain, Heartburn, Diarrhea,                            Nausea                           Clinical details in recent office note                           Normal labs so far Medicines:                Monitored Anesthesia Care Procedure:                Pre-Anesthesia Assessment:                           - Prior to the procedure, a History and Physical                            was performed, and patient medications and                            allergies were reviewed. The patient's tolerance of                            previous anesthesia was also reviewed. The risks                            and benefits of the procedure and the sedation                            options and risks were discussed with the patient.                            All questions were answered, and informed consent                            was obtained. Prior Anticoagulants: The patient has                            taken no anticoagulant or antiplatelet agents. ASA                            Grade Assessment: II - A patient with mild systemic                            disease. After reviewing the risks and benefits,  the patient was deemed in satisfactory condition to                            undergo the procedure.                           After obtaining informed consent, the endoscope was                            passed under direct vision. Throughout the                            procedure, the patient's blood pressure, pulse, and                            oxygen saturations were monitored continuously. The                            Olympus  scope [id number] was introduced through the                            mouth, and advanced to the second part of duodenum.                            The upper GI endoscopy was accomplished without                            difficulty. The patient tolerated the procedure                            well. Scope In: Scope Out: Findings:                 The larynx was normal.                           The esophagus was normal.                           Normal mucosa was found in the entire examined                            stomach. Biopsies were taken with a cold forceps                            for histology. (Antrum and body in same Path jar to                            rule out H. pylori)                           The cardia and gastric fundus were normal on                            retroflexion. (Hill grade 1)  Normal mucosa was found in the second portion of                            the duodenum. Biopsies for histology were taken                            with a cold forceps for evaluation of celiac                            disease. Complications:            No immediate complications. Estimated Blood Loss:     Estimated blood loss was minimal. Impression:               - Normal larynx.                           - Normal esophagus.                           - Normal mucosa was found in the entire stomach.                            Biopsied.                           - Normal mucosa was found in the second portion of                            the duodenum. Biopsied. Recommendation:           - Patient has a contact number available for                            emergencies. The signs and symptoms of potential                            delayed complications were discussed with the                            patient. Return to normal activities tomorrow.                            Written discharge instructions were provided to the                             patient.                           - Resume previous diet.                           - Continue present medications.                           - Await pathology results.                           -  See the other procedure note for documentation of                            additional recommendations. Jolyn Deshmukh L. Legrand, MD 08/02/2024 11:46:23 AM This report has been signed electronically.

## 2024-08-07 ENCOUNTER — Telehealth: Payer: Self-pay

## 2024-08-07 NOTE — Telephone Encounter (Signed)
  Follow up Call-     08/02/2024   10:34 AM  Call back number  Post procedure Call Back phone  # [phone]  Permission to leave phone message Yes     Patient questions:  Do you have a fever, pain , or abdominal swelling? No. Pain Score  0 *  Have you tolerated food without any problems? Yes.    Have you been able to return to your normal activities? Yes.    Do you have any questions about your discharge instructions: Diet   No. Medications  No. Follow up visit  No.  Do you have questions or concerns about your Care? No.  Actions: * If pain score is 4 or above: No action needed, pain <4.

## 2024-08-09 LAB — SURGICAL PATHOLOGY

## 2024-08-13 ENCOUNTER — Ambulatory Visit: Payer: Self-pay | Admitting: Gastroenterology

## 2024-08-14 MED ORDER — HYOSCYAMINE SULFATE 0.125 MG SL SUBL: 0.1250 mg | SUBLINGUAL_TABLET | Freq: Three times a day (TID) | SUBLINGUAL | 2 refills | Status: DC | PRN

## 2024-08-24 ENCOUNTER — Other Ambulatory Visit: Payer: Self-pay | Admitting: Gastroenterology

## 2024-09-25 ENCOUNTER — Encounter: Payer: Self-pay | Admitting: Gastroenterology

## 2024-09-25 ENCOUNTER — Ambulatory Visit: Admitting: Gastroenterology

## 2024-09-25 VITALS — BP 114/70 | HR 83 | Ht 68.0 in | Wt 264.0 lb

## 2024-09-25 DIAGNOSIS — K760 Fatty (change of) liver, not elsewhere classified: Secondary | ICD-10-CM

## 2024-09-25 DIAGNOSIS — K219 Gastro-esophageal reflux disease without esophagitis: Secondary | ICD-10-CM | POA: Diagnosis not present

## 2024-09-25 DIAGNOSIS — K582 Mixed irritable bowel syndrome: Secondary | ICD-10-CM | POA: Diagnosis not present

## 2024-09-25 DIAGNOSIS — R1084 Generalized abdominal pain: Secondary | ICD-10-CM

## 2024-09-25 NOTE — Patient Instructions (Signed)
 Low-FODMAP Eating Plan  FODMAP stands for fermentable oligosaccharides, disaccharides, monosaccharides, and polyols. These are sugars that are hard for some people to digest. A low-FODMAP eating plan may help some people who have irritable bowel syndrome (IBS) and certain other bowel (intestinal) diseases to manage their symptoms. This meal plan can be complicated to follow. Work with a diet and nutrition specialist (dietitian) to make a low-FODMAP eating plan that is right for you. A dietitian can help make sure that you get enough nutrition from this diet. What are tips for following this plan? Reading food labels Check labels for hidden FODMAPs such as: High-fructose syrup. Honey. Agave. Natural fruit flavors. Onion or garlic powder. Choose low-FODMAP foods that contain 3-4 grams of fiber per serving. Check food labels for serving sizes. Eat only one serving at a time to make sure FODMAP levels stay low. Shopping Shop with a list of foods that are recommended on this diet and make a meal plan. Meal planning Follow a low-FODMAP eating plan for up to 6 weeks, or as told by your health care provider or dietitian. To follow the eating plan: Eliminate high-FODMAP foods from your diet completely. Choose only low-FODMAP foods to eat. You will do this for 2-6 weeks. Gradually reintroduce high-FODMAP foods into your diet one at a time. Most people should wait a few days before introducing the next new high-FODMAP food into their meal plan. Your dietitian can recommend how quickly you may reintroduce foods. Keep a daily record of what and how much you eat and drink. Make note of any symptoms that you have after eating. Review your daily record with a dietitian regularly to identify which foods you can eat and which foods you should avoid. General tips Drink enough fluid each day to keep your urine pale yellow. Avoid processed foods. These often have added sugar and may be high in FODMAPs. Avoid  most dairy products, whole grains, and sweeteners. Work with a dietitian to make sure you get enough fiber in your diet. Avoid high FODMAP foods at meals to manage symptoms. Recommended foods Fruits Bananas, oranges, tangerines, lemons, limes, blueberries, raspberries, strawberries, grapes, cantaloupe, honeydew melon, kiwi, papaya, passion fruit, and pineapple. Limited amounts of dried cranberries, banana chips, and shredded coconut. Vegetables Eggplant, zucchini, cucumber, peppers, green beans, bean sprouts, lettuce, arugula, kale, Swiss chard, spinach, collard greens, bok choy, summer squash, potato, and tomato. Limited amounts of corn, carrot, and sweet potato. Green parts of scallions. Grains Gluten-free grains, such as rice, oats, buckwheat, quinoa, corn, polenta, and millet. Gluten-free pasta, bread, or cereal. Rice noodles. Corn tortillas. Meats and other proteins Unseasoned beef, pork, poultry, or fish. Eggs. [place]. Tofu (firm) and tempeh. Limited amounts of nuts and seeds, such as almonds, walnuts, brazil nuts, pecans, peanuts, nut butters, pumpkin seeds, chia seeds, and sunflower seeds. Dairy Lactose-free milk, yogurt, and kefir. Lactose-free cottage cheese and ice cream. Non-dairy milks, such as almond, coconut, hemp, and rice milk. Non-dairy yogurt. Limited amounts of goat cheese, brie, mozzarella, parmesan, swiss, and other hard cheeses. Fats and oils Butter-free spreads. Vegetable oils, such as olive, canola, and sunflower oil. Seasoning and other foods Artificial sweeteners with names that do not end in ol, such as aspartame, saccharine, and stevia. Maple syrup, white table sugar, raw sugar, brown sugar, and molasses. Mayonnaise, soy sauce, and tamari. Fresh basil, coriander, parsley, rosemary, and thyme. Beverages Water and mineral water. Sugar-sweetened soft drinks. Small amounts of orange juice or cranberry juice. Black and green tea. Most dry wines.  Coffee. The items listed  above may not be a complete list of foods and beverages you can eat. Contact a dietitian for more information. Foods to avoid Fruits Fresh, dried, and juiced forms of apple, pear, watermelon, peach, plum, cherries, apricots, blackberries, boysenberries, figs, nectarines, and mango. Avocado. Vegetables Chicory root, artichoke, asparagus, cabbage, snow peas, Brussels sprouts, broccoli, sugar snap peas, mushrooms, celery, and cauliflower. Onions, garlic, leeks, and the white part of scallions. Grains Wheat, including kamut, durum, and semolina. Barley and bulgur. Couscous. Wheat-based cereals. Wheat noodles, bread, crackers, and pastries. Meats and other proteins Fried or fatty meat. Sausage. Cashews and pistachios. Soybeans, baked beans, black beans, chickpeas, kidney beans, fava beans, navy beans, lentils, black-eyed peas, and split peas. Dairy Milk, yogurt, ice cream, and soft cheese. Cream and sour cream. Milk-based sauces. Custard. Buttermilk. Soy milk. Seasoning and other foods Any sugar-free gum or candy. Foods that contain artificial sweeteners such as sorbitol, mannitol, isomalt, or xylitol. Foods that contain honey, high-fructose corn syrup, or agave. Bouillon, vegetable stock, beef stock, and chicken stock. Garlic and onion powder. Condiments made with onion, such as hummus, chutney, pickles, relish, salad dressing, and salsa. Tomato paste. Beverages Chicory-based drinks. Coffee substitutes. Chamomile tea. Fennel tea. Sweet or fortified wines such as port or sherry. Diet soft drinks made with isomalt, mannitol, maltitol, sorbitol, or xylitol. Apple, pear, and mango juice. Juices with high-fructose corn syrup. The items listed above may not be a complete list of foods and beverages you should avoid. Contact a dietitian for more information. Summary FODMAP stands for fermentable oligosaccharides, disaccharides, monosaccharides, and polyols. These are sugars that are hard for some people to  digest. A low-FODMAP eating plan is a short-term diet that helps to ease symptoms of certain bowel diseases. The eating plan usually lasts up to 6 weeks. After that, high-FODMAP foods are reintroduced gradually and one at a time. This can help you find out which foods may be causing symptoms. A low-FODMAP eating plan can be complicated. It is best to work with a dietitian who has experience with this type of plan. This information is not intended to replace advice given to you by your health care provider. Make sure you discuss any questions you have with your health care provider. Document Revised: 08/08/2023 Document Reviewed: 08/08/2023 Elsevier Patient Education  2025 [employer]   STOP HYOSCYAMINE   We have given you samples of the following medication to take: IBgard  CONTINUE PROTONIX  AS NEEDED  CONTINUE BENEFIBER  TALK WITH YOUR PCP ABOUT ANY MENTAL HEALTH CONCERNS  FOLLOW UP AS NEEDED  _______________________________________________________  If your blood pressure at your visit was 140/90 or greater, please contact your primary care physician to follow up on this.  _______________________________________________________  If you are age 48 or older, your body mass index should be between 23-30. Your Body mass index is 40.14 kg/m. If this is out of the aforementioned range listed, please consider follow up with your Primary Care Provider.  If you are age 63 or younger, your body mass index should be between 19-25. Your Body mass index is 40.14 kg/m. If this is out of the aformentioned range listed, please consider follow up with your Primary Care Provider.   ________________________________________________________  The [facility] providers would like to encourage you to use MYCHART to communicate with providers for non-urgent requests or questions.  Due to long hold times on the telephone, sending your provider a message by [facility] may be a faster and more efficient way  to  get a response.  Please allow 48 business hours for a response.  Please remember that this is for non-urgent requests.  _______________________________________________________  [facility] is using a team-based approach to care.  Your team is made up of your doctor and two to three APPS. Our APPS (Nurse Practitioners and Physician Assistants) work with your physician to ensure care continuity for you. They are fully qualified to address your health concerns and develop a treatment plan. They communicate directly with your gastroenterologist to care for you. Seeing the Advanced Practice Practitioners on your physician's team can help you by facilitating care more promptly, often allowing for earlier appointments, access to diagnostic testing, procedures, and other specialty referrals.

## 2024-09-25 NOTE — Progress Notes (Signed)
 "  Nicole Floyd [id number] [birth year]   Chief Complaint: Follow up  Referring Provider: Starla Stapler, FNP Primary GI MD: Dr. Legrand  HPI: Nicole Floyd is a 44 y.o. female with past medical history of hypothyroidism, vitamin D deficiency, obesity, GERD who presents today for follow up.    Referred by PCP for intermittent diarrhea.  Per referral note, having heartburn issues and frequent diarrhea.  Omeprazole helping some with reflux but bowel habit changes not improved.  Requested GI referral.   Stool studies 07/2023: Negative C. difficile and stool culture   Labs 10/14/2023: Normal lipase, CMP, CBC without anemia, negative alpha gal   Labs 03/09/2024: Normal TSH, hemoglobin A1c 5.4   No apparent testing for H. Pylori.  Last seen in office 06/29/2024 for multiple GI complaints ongoing for about a year.  Frequent acid reflux and abdominal burning sensation.  Felt that omeprazole helped with reflux but caused constipation.  Had change in bowel habits with frequent bowel movements up to 5 times a day, sometimes loose.  Some perianal irritation but no rectal bleeding.  Had seen some black stools intermittently.  No prior EGD or colonoscopy.  Endorsed discomfort after eating and sometimes nausea and vomiting.  She was scheduled for EGD/colonoscopy, RUQ ultrasound ordered, labs, started on Protonix  40 mg daily as well as Benefiber 1 tablespoon daily.  Labs were normal.  Abdominal ultrasound showed hepatic steatosis and otherwise unremarkable.  Low risk fib 4 score of 0.65.  Advised regular monitoring of labs every 6 months, repeat liver imaging every 2 to 3 years.  Patient underwent EGD/colonoscopy 08/02/2024.  Findings were normal.  Recommended to have repeat colonoscopy in 10 years.  All biopsies returned normal and patient suspected to have IBS.  Recommended trial of antispasmodic with hyoscyamine .  Was given some dietary advice with her instructions day of endoscopy.  Advised to follow-up in  office.   Discussed the use of AI scribe software for clinical note transcription with the patient, who gave verbal consent to proceed.  History of Present Illness Nicole Floyd is a 44 year old female with irritable bowel syndrome and fatty liver who presents for follow-up after recent upper endoscopy and colonoscopy.  Abdominal Pain and Bowel Habit Changes: - Abdominal pain occurs approximately once or twice per month - Recent episodes occurred around the time of her last office visit and procedures, followed by a month and a half without symptoms - Pain sometimes correlates with her menstrual cycle, though not consistently - Suspects perimenopausal changes due to skipped cycles - Bowel habits fluctuate, with predominantly loose stools and occasional constipation - No specific dietary triggers identified - No dietary recommendations for IBS received  Medication Response and Management: - Over-the-counter medication previously caused constipation - Switched to Protonix , which does not cause constipation and is preferred - Protonix  40 mg taken as needed, typically for several days during episodes of heartburn or upset stomach - Symptom improvement noted after a couple of days of Protonix  use - Hyoscyamine  discontinued due to lack of benefit and insurance issues - Benefiber used for several days in a row, providing definite symptom relief; plans to continue regular use - Has not tried Ibgard   Gastroesophageal Reflux Symptoms: - Heartburn and upset stomach occur episodically - Protonix  provides improvement during symptomatic episodes  Psychosocial Factors: - Stress and any nerves type thing have historically upset her stomach - Symptoms have also occurred during periods of less stress  Recent Diagnostic Evaluation: - Recent upper endoscopy and colonoscopy were  normal, which she finds reassuring  Hepatic Findings: - Fatty liver identified on ultrasound in October - Liver  function tests were normal   Previous GI Procedures/Imaging   EGD 08/02/2024 - Normal larynx.  - Normal esophagus.  - Normal mucosa was found in the entire stomach. Biopsied.  - Normal mucosa was found in the second portion of the duodenum. Biopsied.  Colonoscopy 08/02/2024 - The examined portion of the ileum was normal.  - Normal mucosa in the entire examined colon. Biopsied.  - The examination was otherwise normal on direct and retroflexion views. - Recall 10 years  RUQ US  07/07/2024 IMPRESSION: 1. Increased hepatic echogenicity, a nonspecific finding that is most commonly seen on the basis of steatosis in the absence of known liver disease. 2. Otherwise unremarkable exam.  Past Medical History:  Diagnosis Date   Diarrhea    GERD (gastroesophageal reflux disease)    Nausea     Past Surgical History:  Procedure Laterality Date   TUBAL LIGATION      Current Outpatient Medications  Medication Sig Dispense Refill   Cholecalciferol (VITAMIN D-3) 25 MCG (1000 UT) CAPS Take by mouth.     hyoscyamine  (LEVSIN  SL) 0.125 MG SL tablet PLACE 1 TABLET (0.125 MG TOTAL) UNDER THE TONGUE EVERY 8 (EIGHT) HOURS AS NEEDED FOR CRAMPING. 90 tablet 1   ipratropium (ATROVENT) 0.06 % nasal spray Place 2 sprays into both nostrils 4 (four) times daily.     levocetirizine (XYZAL) 2.5 MG/5ML solution Take 2.5 mg by mouth every evening.     levothyroxine (SYNTHROID) 125 MCG tablet Take 125 mcg by mouth every morning.     pantoprazole  (PROTONIX ) 40 MG tablet TAKE 1 TABLET BY MOUTH EVERY DAY 90 tablet 1   Oxymetazoline HCl (NASAL SPRAY NA) Place into the nose. (Patient not taking: Reported on 06/29/2024)     Prenatal Vit-Fe Fumarate-FA (PRENATAL MULTIVITAMIN) TABS tablet Take 1 tablet by mouth daily at 12 noon. (Patient not taking: Reported on 06/29/2024)     No current facility-administered medications for this visit.    Allergies as of 09/25/2024   (No Known Allergies)    Family History   Problem Relation Age of Onset   Diabetes Father    Hypertension Father     Social History[1]   Review of Systems:    Gastrointestinal: See HPI and otherwise negative   Physical Exam:  Vital signs: BP 114/70   Pulse 83   Ht 5' 8 (1.727 m)   Wt 264 lb (119.7 kg)   BMI 40.14 kg/m   Wt Readings from Last 3 Encounters:  09/25/24 264 lb (119.7 kg)  08/02/24 259 lb (117.5 kg)  06/29/24 259 lb (117.5 kg)     Constitutional: Pleasant, obese female in NAD, alert and cooperative Head:  Normocephalic and atraumatic.  Respiratory: Respirations even and unlabored. Lungs clear to auscultation bilaterally.  No wheezes, crackles, or rhonchi.  Cardiovascular:  Regular rate and rhythm. No murmurs. No peripheral edema. Gastrointestinal:  Soft, nondistended, nontender. No rebound or guarding. Normal bowel sounds. No appreciable masses or hepatomegaly. Rectal:  Not performed.  Neurologic:  Alert and oriented x4;  grossly normal neurologically.  Skin:   Dry and intact without significant lesions or rashes. Psychiatric: Oriented to person, place and time. Demonstrates good judgement and reason without abnormal affect or behaviors.   Assessment/Plan:   Assessment & Plan Irritable bowel syndrome Chronic, fluctuating symptoms with diarrhea and constipation, currently intermittent and not severe. Normal endoscopy and colonoscopy support diagnosis. Symptoms  may be influenced by hormonal changes and stress. Hyoscyamine  was ineffective.  - Discontinued hyoscyamine  due to lack of efficacy and insurance issues. - Provided peppermint oil capsules (IBgard) as an alternative, with counseling on potential reflux exacerbation. - Recommended low FODMAP diet with educational handout. - Encouraged continued use of Benefiber. - Discussed stress and mental health's role in IBS, advised primary care follow-up for mental health management if needed. - Patient prefers to follow up as needed  Gastroesophageal  reflux disease Mild symptoms managed with as-needed Protonix . Prefers current dose.   - Continued Protonix  40 mg as needed. - Discussed reducing dose to 20 mg, but she elected to remain on 40 mg. - Advised Protonix  can be prescribed by primary care if needed. - Instructed to monitor for worsening reflux with peppermint oil and discontinue if symptoms worsen.   Camie Furbish, PA-C [facility] 09/25/2024, 11:04 AM  Patient Care Team: Starla Stapler, FNP as PCP - General (Family Medicine)       [1]  Social History Tobacco Use   Smoking status: Never   Smokeless tobacco: Never  Vaping Use   Vaping status: Never Used  Substance Use Topics   Alcohol use: Never   Drug use: Never   "

## 2024-09-27 NOTE — Progress Notes (Signed)
 ____________________________________________________________  Attending physician addendum:  Thank you for sending this case to me. I have reviewed the entire note and agree with the plan.   Victory Brand, MD  ____________________________________________________________
